# Patient Record
Sex: Male | Born: 1980 | Race: Black or African American | Hispanic: No | Marital: Single | Smoking: Never smoker
Health system: Southern US, Community
[De-identification: ages and names within clinical notes are randomized; demographics above are authoritative.]

---

## 2014-10-11 ENCOUNTER — Emergency Department (HOSPITAL_COMMUNITY)
Admission: EM | Admit: 2014-10-11 | Discharge: 2014-10-11 | Disposition: A | Discharge status: Home / Self Care | Payer: No Typology Code available for payment source | Attending: Emergency Medicine | Admitting: Emergency Medicine

## 2014-10-11 ENCOUNTER — Emergency Department (HOSPITAL_COMMUNITY): Payer: No Typology Code available for payment source

## 2014-10-11 ENCOUNTER — Encounter (HOSPITAL_COMMUNITY): Payer: Self-pay

## 2014-10-11 DIAGNOSIS — S161XXA Strain of muscle, fascia and tendon at neck level, initial encounter: Secondary | ICD-10-CM | POA: Diagnosis not present

## 2014-10-11 DIAGNOSIS — S20219A Contusion of unspecified front wall of thorax, initial encounter: Secondary | ICD-10-CM | POA: Diagnosis not present

## 2014-10-11 DIAGNOSIS — Y9389 Activity, other specified: Secondary | ICD-10-CM | POA: Diagnosis not present

## 2014-10-11 DIAGNOSIS — Y92410 Unspecified street and highway as the place of occurrence of the external cause: Secondary | ICD-10-CM | POA: Diagnosis not present

## 2014-10-11 DIAGNOSIS — S199XXA Unspecified injury of neck, initial encounter: Secondary | ICD-10-CM | POA: Diagnosis present

## 2014-10-11 DIAGNOSIS — Y998 Other external cause status: Secondary | ICD-10-CM | POA: Insufficient documentation

## 2014-10-11 DIAGNOSIS — S4992XA Unspecified injury of left shoulder and upper arm, initial encounter: Secondary | ICD-10-CM | POA: Diagnosis not present

## 2014-10-11 MED ORDER — HYDROCODONE-ACETAMINOPHEN 5-325 MG PO TABS: 1.0000 | ORAL_TABLET | Freq: Four times a day (QID) | ORAL | Status: AC | PRN

## 2014-10-11 MED ORDER — IBUPROFEN 800 MG PO TABS: 800.0000 mg | ORAL_TABLET | Freq: Three times a day (TID) | ORAL | Status: AC | PRN

## 2014-10-11 NOTE — Discharge Instructions (Signed)
Return here as needed. Follow up as needed with the orthopedist provided. Ice and heat on the areas that are sore.
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
## 2016-11-08 ENCOUNTER — Encounter (HOSPITAL_COMMUNITY): Payer: Self-pay | Admitting: Emergency Medicine

## 2016-11-08 ENCOUNTER — Emergency Department (HOSPITAL_COMMUNITY)
Admission: EM | Admit: 2016-11-08 | Discharge: 2016-11-08 | Disposition: A | Discharge status: Home / Self Care | Payer: Self-pay | Attending: Emergency Medicine | Admitting: Emergency Medicine

## 2016-11-08 DIAGNOSIS — Z79899 Other long term (current) drug therapy: Secondary | ICD-10-CM | POA: Insufficient documentation

## 2016-11-08 DIAGNOSIS — K2900 Acute gastritis without bleeding: Secondary | ICD-10-CM | POA: Insufficient documentation

## 2016-11-08 LAB — CBC
HCT: 41.1 % (ref 39.0–52.0)
HEMOGLOBIN: 14 g/dL (ref 13.0–17.0)
MCH: 29.8 pg (ref 26.0–34.0)
MCHC: 34.1 g/dL (ref 30.0–36.0)
MCV: 87.4 fL (ref 78.0–100.0)
Platelets: 143 10*3/uL — ABNORMAL LOW (ref 150–400)
RBC: 4.7 MIL/uL (ref 4.22–5.81)
RDW: 13.4 % (ref 11.5–15.5)
WBC: 5.2 10*3/uL (ref 4.0–10.5)

## 2016-11-08 LAB — I-STAT TROPONIN, ED
TROPONIN I, POC: 0 ng/mL (ref 0.00–0.08)
Troponin i, poc: 0.01 ng/mL (ref 0.00–0.08)

## 2016-11-08 LAB — COMPREHENSIVE METABOLIC PANEL
ALBUMIN: 4 g/dL (ref 3.5–5.0)
ALK PHOS: 47 U/L (ref 38–126)
ALT: 26 U/L (ref 17–63)
ANION GAP: 12 (ref 5–15)
AST: 24 U/L (ref 15–41)
BUN: 7 mg/dL (ref 6–20)
CALCIUM: 9.6 mg/dL (ref 8.9–10.3)
CHLORIDE: 100 mmol/L — AB (ref 101–111)
CO2: 26 mmol/L (ref 22–32)
Creatinine, Ser: 1.13 mg/dL (ref 0.61–1.24)
GFR calc Af Amer: >60 mL/min (ref >60)
GFR calc non Af Amer: >60 mL/min (ref >60)
GLUCOSE: 91 mg/dL (ref 65–99)
Potassium: 3.4 mmol/L — ABNORMAL LOW (ref 3.5–5.1)
SODIUM: 138 mmol/L (ref 135–145)
Total Bilirubin: 0.9 mg/dL (ref 0.3–1.2)
Total Protein: 7 g/dL (ref 6.5–8.1)

## 2016-11-08 LAB — URINALYSIS, ROUTINE W REFLEX MICROSCOPIC
BILIRUBIN URINE: NEGATIVE
GLUCOSE, UA: NEGATIVE mg/dL
HGB URINE DIPSTICK: NEGATIVE
Ketones, ur: NEGATIVE mg/dL
Leukocytes, UA: NEGATIVE
Nitrite: NEGATIVE
PH: 5 (ref 5.0–8.0)
Protein, ur: NEGATIVE mg/dL
SPECIFIC GRAVITY, URINE: 1.019 (ref 1.005–1.030)

## 2016-11-08 LAB — LIPASE, BLOOD: Lipase: 18 U/L (ref 11–51)

## 2016-11-08 MED ORDER — FAMOTIDINE 20 MG PO TABS
20.0000 mg | ORAL_TABLET | Freq: Once | ORAL | Status: AC
  Administered 2016-11-08: 20 mg via ORAL
  Filled 2016-11-08: qty 1

## 2016-11-08 MED ORDER — FAMOTIDINE 20 MG PO TABS: 20.0000 mg | ORAL_TABLET | Freq: Two times a day (BID) | ORAL | 0 refills | Status: AC

## 2016-11-08 MED ORDER — GI COCKTAIL ~~LOC~~
30.0000 mL | Freq: Once | ORAL | Status: AC
Start: 2016-11-08 — End: 2016-11-08
  Administered 2016-11-08: 30 mL via ORAL
  Filled 2016-11-08: qty 30

## 2016-11-08 MED ORDER — FAMOTIDINE IN NACL 20-0.9 MG/50ML-% IV SOLN: 20.0000 mg | Freq: Once | INTRAVENOUS | Status: DC

## 2016-11-08 MED ORDER — OMEPRAZOLE 20 MG PO CPDR: 20.0000 mg | DELAYED_RELEASE_CAPSULE | Freq: Every day | ORAL | 0 refills | Status: AC

## 2016-11-08 NOTE — ED Triage Notes (Signed)
PT reports he has been having acid reflux Sx's. Pt reports he had a large  Fried food with tomato sauce  And went to bed just after eating.

## 2016-11-08 NOTE — ED Provider Notes (Signed)
[facility] Provider Note   CSN: [id number] Arrival date & time: 11/08/16  [id number]     History   Chief Complaint Chief Complaint  Patient presents with  . Abdominal Pain    HPI Ryan Sellers is a 36 y.o. male.  HPI [age] year old African-American male with no pertinent past medical history presents to the emergency department today with complaints of epigastric abdominal pain.  The patient states that he has been experiencing acid reflux symptoms over the past week.  States that he works night shifts and when he gets off work in the mornings he eats fried food and acidic foods and then goes to bed after eating.  States that his epigastric pain is present after he eats spicy foods.  Describes the pain as burning sensation.  States that sometimes it does radiate to his chest.  Denies any associated shortness of breath.  Does report some abdominal discomfort at this time.  States that he has not tried anything for his symptoms.  He has noticed that when he does not eat when he works and goes to bed that his symptoms are not present.  States that his symptoms are intermittent.  Denies any associated diaphoresis, nausea, emesis, diarrhea, urinary symptoms.  Denies any chest pain at this time.  Food makes the pain worse.  Nothing makes the pain better.  Pt denies any fever, nigh sweats, chill, ha, vision changes, lightheadedness, dizziness, congestion, neck pain, sob, cough, n/v/d, urinary symptoms, change in bowel habits, melena, hematochezia, lower extremity paresthesias.  History reviewed. No pertinent past medical history.  There are no active problems to display for this patient.   History reviewed. No pertinent surgical history.     Home Medications    Prior to Admission medications   Medication Sig Start Date End Date Taking? Authorizing Provider  famotidine (PEPCID) 20 MG tablet Take 1 tablet (20 mg total) by mouth 2 (two) times daily.  11/08/16   Rise MuLeaphart, Praneel T, PA-C  HYDROcodone-acetaminophen (NORCO/VICODIN) 5-325 MG tablet Take 1 tablet by mouth every 6 (six) hours as needed for moderate pain. 10/11/14   Lawyer, Cristal Deerhristopher, PA-C  ibuprofen (ADVIL,MOTRIN) 800 MG tablet Take 1 tablet (800 mg total) by mouth every 8 (eight) hours as needed. 10/11/14   Lawyer, Cristal Deerhristopher, PA-C  omeprazole (PRILOSEC) 20 MG capsule Take 1 capsule (20 mg total) by mouth daily. 11/08/16   Rise MuLeaphart, Onofre T, PA-C    Family History No family history on file.  Social History Social History  Substance Use Topics  . Smoking status: Never Smoker  . Smokeless tobacco: Never Used  . Alcohol use No     Allergies   Patient has no known allergies.   Review of Systems Review of Systems  Constitutional: Negative for chills and fever.  HENT: Negative for congestion and sore throat.   Eyes: Negative for visual disturbance.  Respiratory: Negative for cough and shortness of breath.   Cardiovascular: Positive for chest pain (burning).  Gastrointestinal: Positive for abdominal pain. Negative for diarrhea, nausea and vomiting.  Genitourinary: Negative for dysuria, flank pain, frequency, hematuria, scrotal swelling, testicular pain and urgency.  Musculoskeletal: Negative for arthralgias and myalgias.  Skin: Negative for rash.  Neurological: Negative for dizziness, syncope, weakness, light-headedness, numbness and headaches.  Psychiatric/Behavioral: Negative for sleep disturbance. The patient is not nervous/anxious.      Physical Exam Updated Vital Signs BP 122/85   Pulse 61   Temp 98.3 F (36.8 C) (Oral)   Resp 14  SpO2 100%   Physical Exam  Constitutional: He is oriented to person, place, and time. He appears well-developed and well-nourished.  Non-toxic appearance. No distress.  HENT:  Head: Normocephalic and atraumatic.  Nose: Nose normal.  Mouth/Throat: Oropharynx is clear and moist.  Eyes: Pupils are equal, round, and  reactive to light. Conjunctivae are normal. Right eye exhibits no discharge. Left eye exhibits no discharge.  Neck: Normal range of motion. Neck supple. No JVD present. No tracheal deviation present.  Cardiovascular: Normal rate, regular rhythm, normal heart sounds and intact distal pulses.  Exam reveals no gallop and no friction rub.   No murmur heard. Pulmonary/Chest: Effort normal and breath sounds normal. No respiratory distress. He has no wheezes. He has no rales. He exhibits no tenderness.  No hypoxia or tachypnea.  Abdominal: Soft. Bowel sounds are normal. He exhibits no distension. There is tenderness in the epigastric area. There is no rigidity, no rebound, no guarding, no CVA tenderness, no tenderness at McBurney's point and negative Murphy's sign.  Musculoskeletal: Normal range of motion.  No lower extremity edema or calf tenderness.  Lymphadenopathy:    He has no cervical adenopathy.  Neurological: He is alert and oriented to person, place, and time.  Skin: Skin is warm and dry. Capillary refill takes less than 2 seconds. He is not diaphoretic.  Psychiatric: His behavior is normal. Judgment and thought content normal.  Nursing note and vitals reviewed.    ED Treatments / Results  Labs (all labs ordered are listed, but only abnormal results are displayed) Labs Reviewed  COMPREHENSIVE METABOLIC PANEL - Abnormal; Notable for the following:       Result Value   Potassium 3.4 (*)    Chloride 100 (*)    All other components within normal limits  CBC - Abnormal; Notable for the following:    Platelets 143 (*)    All other components within normal limits  LIPASE, BLOOD  URINALYSIS, ROUTINE W REFLEX MICROSCOPIC  I-STAT TROPONIN, ED  I-STAT TROPONIN, ED    EKG  EKG Interpretation  Date/Time:  Saturday November 08 2016 09:33:05 EDT Ventricular Rate:  59 PR Interval:    QRS Duration: 96 QT Interval:  396 QTC Calculation: 393 R Axis:   75 Text Interpretation:  Sinus rhythm  Ventricular premature complex Left ventricular hypertrophy Nonspecific T abnormalities, lateral leads ST elev, probable normal early repol pattern No previous ECGs available Confirmed by Alvira Monday ([id number]) on 11/08/2016 11:41:51 AM Also confirmed by Alvira Monday ([id number]), editor Madalyn Rob [phone])  on 11/08/2016 11:44:24 AM       Radiology No results found.  Procedures Procedures (including critical care time)  Medications Ordered in ED Medications  gi cocktail (Maalox,Lidocaine,Donnatal) (30 mLs Oral Given 11/08/16 1138)  famotidine (PEPCID) tablet 20 mg (20 mg Oral Given 11/08/16 1136)     Initial Impression / Assessment and Plan / ED Course  I have reviewed the triage vital signs and the nursing notes.  Pertinent labs & imaging results that were available during my care of the patient were reviewed by me and considered in my medical decision making (see chart for details).     Patient with symptoms consistent with viral gastritis.  Vitals are stable, no fever.  No signs of dehydration, tolerating PO fluids > 6 oz.  Lungs are clear.  No focal abdominal pain, no concern for appendicitis, cholecystitis, pancreatitis, ruptured viscus, UTI, kidney stone, or any other abdominal etiology.  Patient does report some chest discomfort with these  episodes but denies any at this time.  EKG shows no ischemic changes.  Clinical presentation does not seem consistent with myocarditis, pericarditis, ACS, PE, dissection.  Lab work is reassuring.  No leukocytosis.  Lipase is normal.  Doubt pancreatitis.  Mild thrombocytopenia noted.  Patient was given GI cocktail and Pepcid in the ED with complete resolution of symptoms.  Patient feels stable for discharge at this time.  Supportive therapy indicated with return if symptoms worsen.    Pt is hemodynamically stable, in NAD, & able to ambulate in the ED. Evaluation does not show pathology that would require ongoing emergent intervention or  inpatient treatment. I explained the diagnosis to the patient. Pain has been managed & has no complaints prior to dc. Pt is comfortable with above plan and is stable for discharge at this time. All questions were answered prior to disposition. Strict return precautions for f/u to the ED were discussed. Encouraged follow up with PCP.    Final Clinical Impressions(s) / ED Diagnoses   Final diagnoses:  Acute gastritis without hemorrhage, unspecified gastritis type    New Prescriptions Discharge Medication List as of 11/08/2016 12:46 PM    START taking these medications   Details  famotidine (PEPCID) 20 MG tablet Take 1 tablet (20 mg total) by mouth 2 (two) times daily., Starting Sat 11/08/2016, Print    omeprazole (PRILOSEC) 20 MG capsule Take 1 capsule (20 mg total) by mouth daily., Starting Sat 11/08/2016, Print         Olu[place], Kinyon, PA-C 11/08/16 2256    Alvira Monday, MD 11/11/16 2114

## 2016-11-08 NOTE — Discharge Instructions (Signed)
Your abdominal pain is likely from gastritis, reflux or a stomach ulcer. You will need to take the prescribed proton pump inhibitor as directed, and avoid spicy/fatty/acidic foods. Avoid laying down flat within 30 minutes of eating. Avoid NSAIDs like ibuprofen or Aleve on an empty stomach.  Follow up with the gastroenterologist (GI doctor) listed for ongoing evaluation of your abdominal pain. Return to the ER for new or worsening symptoms, any additional concers. ° ° °SEEK IMMEDIATE MEDICAL ATTENTION IF YOU DEVELOP ANY OF THE FOLLOWING SYMPTOMS: °The pain does not go away or becomes severe.  °A temperature above 101 develops.  °Repeated vomiting occurs (multiple episodes).  °Blood is being passed in stools or vomit (bright red or black tarry stools).  °Return also if you develop chest pain, difficulty breathing, dizziness or fainting ° °

## 2016-11-08 NOTE — ED Triage Notes (Signed)
C/o burning to center of abd since Friday at 5pm.  States pain started after "eating heavy."  Denies nausea, vomiting, and diarrhea.

## 2018-01-19 ENCOUNTER — Ambulatory Visit: Payer: Self-pay

## 2018-01-19 ENCOUNTER — Other Ambulatory Visit: Payer: Self-pay | Admitting: Family Medicine

## 2018-01-19 DIAGNOSIS — Z0000 Encounter for general adult medical examination without abnormal findings: Secondary | ICD-10-CM

## 2019-10-31 ENCOUNTER — Other Ambulatory Visit: Payer: Self-pay | Admitting: Family Medicine

## 2019-10-31 ENCOUNTER — Other Ambulatory Visit: Payer: Self-pay

## 2019-10-31 ENCOUNTER — Ambulatory Visit: Payer: Self-pay

## 2019-10-31 DIAGNOSIS — Z0000 Encounter for general adult medical examination without abnormal findings: Secondary | ICD-10-CM

## 2021-02-27 IMAGING — DX DG CHEST 1V
1 series · 1 of 1 positions shown · non-contrast
Comparison: 01/19/2018 chest radiograph and prior.

CLINICAL DATA: Physical exam

EXAM:
CHEST  1 VIEW

[chest pa]
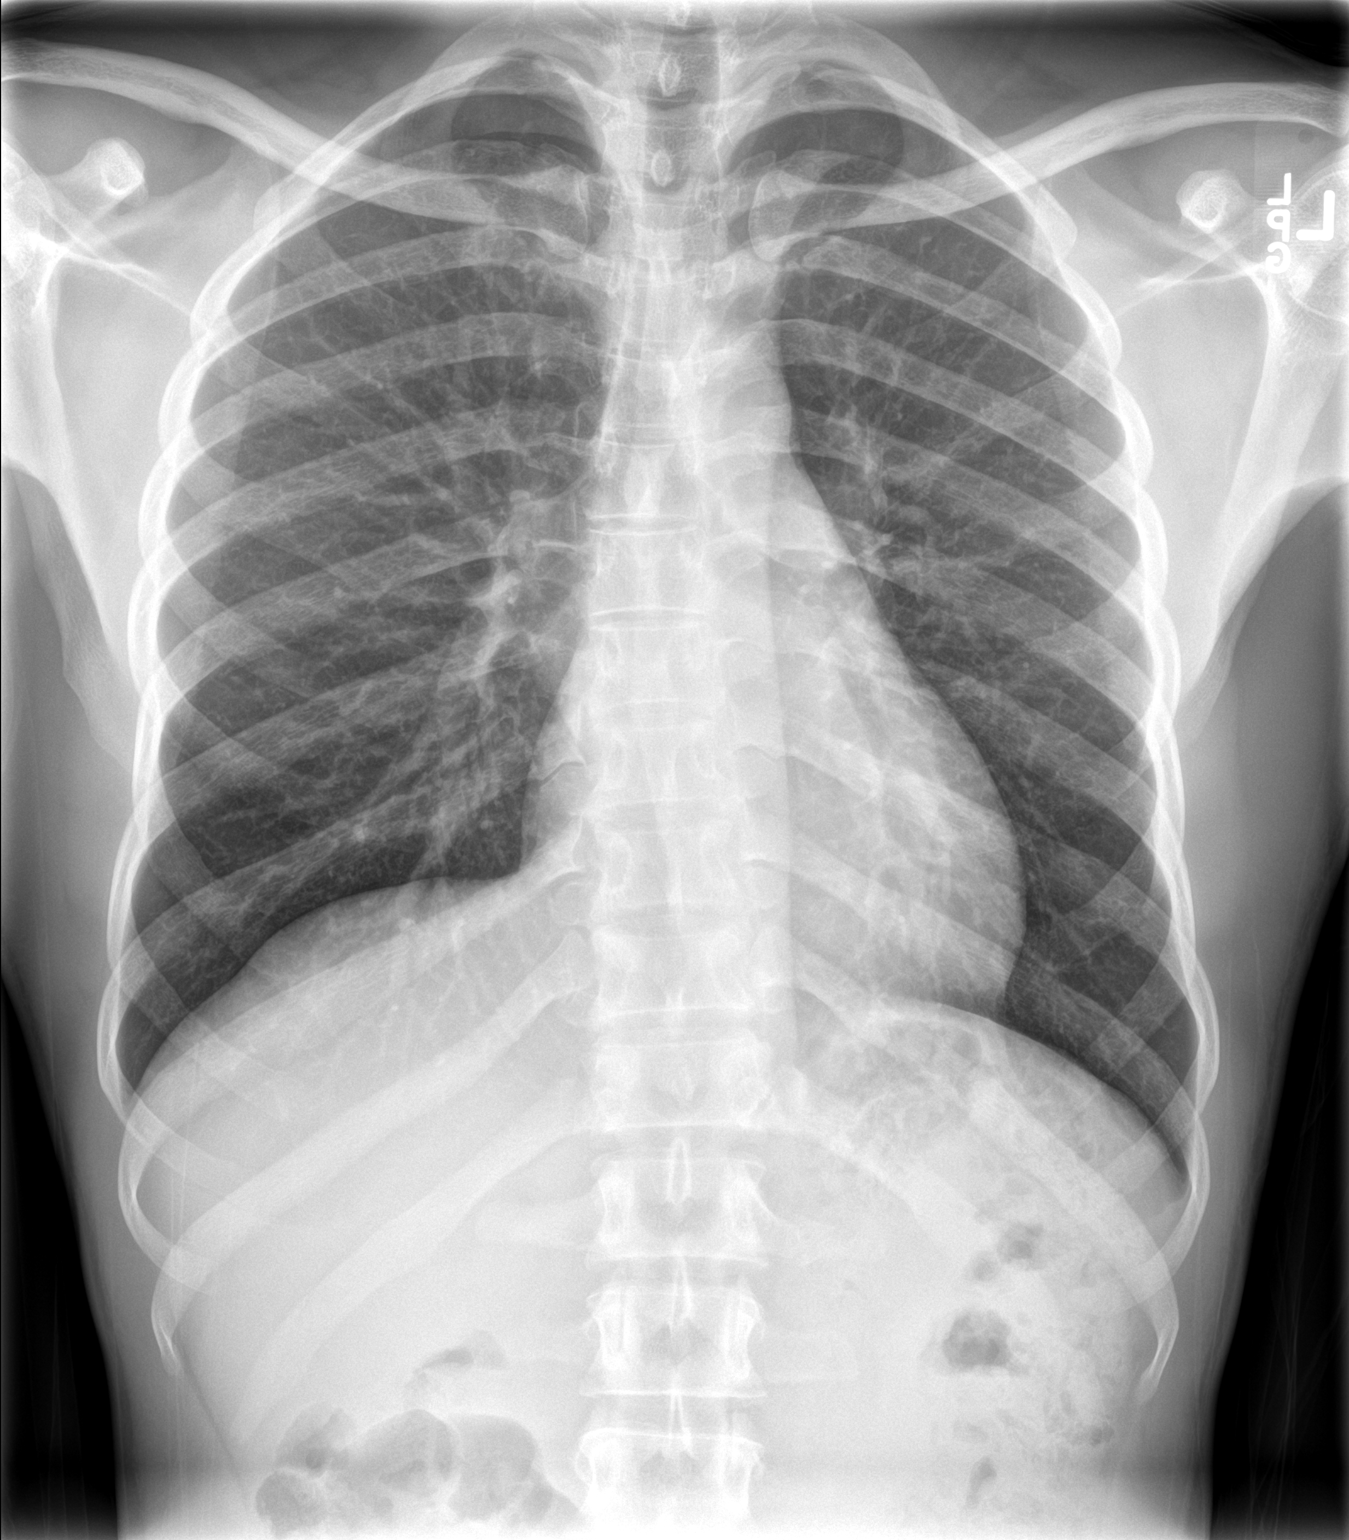

[1 of 1 positions shown; findings below may reference images not displayed]

FINDINGS: The heart size and mediastinal contours are within normal limits.
Both lungs are clear. No acute osseous abnormality.
IMPRESSION: No focal airspace disease.

## 2024-02-16 ENCOUNTER — Ambulatory Visit: Admitting: Nurse Practitioner

## 2024-03-22 ENCOUNTER — Ambulatory Visit: Admitting: Nurse Practitioner
